# Patient Record
Sex: Female | Born: 1985 | Race: Black or African American | Hispanic: No | Marital: Married | Smoking: Never smoker
Health system: Southern US, Community
[De-identification: ages and names within clinical notes are randomized; demographics above are authoritative.]

## PROBLEM LIST (undated history)

## (undated) DIAGNOSIS — Z803 Family history of malignant neoplasm of breast: Secondary | ICD-10-CM

## (undated) DIAGNOSIS — Q249 Congenital malformation of heart, unspecified: Secondary | ICD-10-CM

## (undated) HISTORY — DX: Family history of malignant neoplasm of breast: Z80.3

## (undated) HISTORY — PX: PACEMAKER INSERTION: SHX728

---

## 2005-08-17 ENCOUNTER — Other Ambulatory Visit: Admission: RE | Admit: 2005-08-17 | Discharge: 2005-08-17 | Payer: Self-pay | Admitting: Obstetrics and Gynecology

## 2006-02-05 ENCOUNTER — Inpatient Hospital Stay (HOSPITAL_COMMUNITY): Admission: AD | Admit: 2006-02-05 | Discharge: 2006-02-05 | Payer: Self-pay | Admitting: Obstetrics and Gynecology

## 2006-02-07 ENCOUNTER — Inpatient Hospital Stay (HOSPITAL_COMMUNITY): Admission: AD | Admit: 2006-02-07 | Discharge: 2006-02-09 | Payer: Self-pay | Admitting: Obstetrics and Gynecology

## 2008-04-01 ENCOUNTER — Emergency Department (HOSPITAL_COMMUNITY): Admission: EM | Admit: 2008-04-01 | Discharge: 2008-04-01 | Payer: Self-pay | Admitting: Emergency Medicine

## 2010-04-27 ENCOUNTER — Other Ambulatory Visit (HOSPITAL_COMMUNITY)
Admission: RE | Admit: 2010-04-27 | Discharge: 2010-04-27 | Disposition: A | Discharge status: Home / Self Care | Payer: Self-pay | Source: Ambulatory Visit | Attending: Family Medicine | Admitting: Family Medicine

## 2010-04-27 DIAGNOSIS — Z124 Encounter for screening for malignant neoplasm of cervix: Secondary | ICD-10-CM | POA: Insufficient documentation

## 2010-06-08 LAB — URINE CULTURE: Colony Count: >=100000

## 2010-06-08 LAB — URINALYSIS, ROUTINE W REFLEX MICROSCOPIC
Glucose, UA: NEGATIVE mg/dL
Hgb urine dipstick: NEGATIVE
Ketones, ur: NEGATIVE mg/dL
Protein, ur: NEGATIVE mg/dL
Urobilinogen, UA: 0.2 mg/dL (ref 0.0–1.0)

## 2010-06-08 LAB — WET PREP, GENITAL
Clue Cells Wet Prep HPF POC: NONE SEEN
Trich, Wet Prep: NONE SEEN

## 2010-06-08 LAB — URINE MICROSCOPIC-ADD ON

## 2010-06-08 LAB — POCT PREGNANCY, URINE: Preg Test, Ur: NEGATIVE

## 2010-07-09 NOTE — H&P (Signed)
NAMENORIE, LATENDRESSE                 ACCOUNT NO.:  [id number]   MEDICAL RECORD NO.:  [id number]          PATIENT TYPE:  INP   LOCATION:  9169                          FACILITY:  [facility]   PHYSICIAN:  Osborn Coho, M.D.   DATE OF BIRTH:  [birth year]   DATE OF ADMISSION:  02/07/2006  DATE OF DISCHARGE:                              HISTORY & PHYSICAL   Ms. Chohan is a 25 year old gravida 1, para 0 at 41-3/7 weeks, EDD  January 28, 2006. She presents for induction of labor secondary to post  dates. Her pregnancy has been followed by the M.D. service at [facility] and  is remarkable for a history of congenital heart disease, congenital AV  block with pacemaker 2005, history of abnormal Pap smear, group B strep  negative.  This patient entered prenatal care at the office of [facility] on  August 17, 2005, [facility] determined by 17-week ultrasound and confirmed with  follow-up. Her pregnancy is significant for her history of congenital AV  block with pacemaker in 2005.  Maternal fetal consult was obtained and  the patient per maternal fetal medicine the patient was tested for SSA  and SSB antibodies. These were negative. Fetal echocardiogram found VSD  and baby will be followed post delivery. Cardiologist Dr. Chales Abrahams was  consulted and reports there are no contraindications to pushing during  the second stage.  If operative delivery is needed a magnet over  pacemaker is important during electrocautery use. Anesthesia has been  consulted and no problems were identified as far as anesthesia.  Patient's pregnancy itself has been fairly unremarkable.  She has been  size equal to dates throughout.  She has been normotensive until the  last several weeks of pregnancy.  She has had some elevated blood  pressures. Blood pressure today on admission is 120/64. PIH labs done on  Sunday February 05, 2006 were all within normal limits.  Clean catch  urine protein at that time was negative.  The patient did complete a 24-  hour  urine which was returned yesterday and results are unavailable at  this time.   PRENATAL LAB WORK:  On August 17, 2005 hemoglobin and hematocrit 12.2 and  36.6, platelets 232,000.  Blood type and Rh A+, antibody screen  negative, VDRL nonreactive, rubella immune, hepatitis B surface antigen  negative, HIV nonreactive.  Pap smear within normal limits.  GC and  chlamydia negative.  Quad screen within normal limits. At 26 weeks 1-  hour glucose challenge within normal limits and RPR negative at 36  weeks. Culture of the vaginal tract is negative for group B strep. The  patient has no known drug allergies and she denies the use of tobacco,  alcohol or illicit drugs.  Her height is 5 feet 6 inches, her pre gravid  weight is 160 and her last recorded prenatal weight is 206.   MEDICAL HISTORY:  Is significant for congenital heart defect and she had  a pacemaker placed in 2005. She has had gastric reflux symptoms during  her pregnancy for which she takes Protonix with relief.   FAMILY HISTORY:  Is unremarkable.   GENETIC HISTORY:  As above, congenital heart defect with pacemaker  placed in 2005.   SOCIAL HISTORY:  Ms. Mackowski is a single African American female.  The  father of the baby, Walter [place], is involved and supportive.  They do  not subscribe to a religious faith.   REVIEW OF SYSTEMS:  Is as described above.  The patient presents for  induction of labor secondary to post dates.   PHYSICAL EXAM:  VITAL SIGNS: Stable.  The patient is afebrile.  Blood  pressure 120/64.  HEENT:  Is unremarkable.  HEART:  Regular rate and rhythm.  LUNGS: Clear.  ABDOMEN:  Gravid in its contour.  It is soft and nontender.  Uterine  fundus extends 39 cm above the level of the pubic symphysis.  Leopold's  maneuvers finds the infant to be in a longitudinal lie, cephalic  presentation and the estimated fetal weight is 7 pounds 15 ounces. The  baseline of the fetal heart rate monitor is 140's with average  long-term  variability, reactivity is present with no periodic changes.  The  patient is contracting mildly and irregularly.  EXTREMITIES:  Show 1+ edema.  DTRs are 2+ with no clonus.  There is no  calf tenderness noted bilaterally.   ASSESSMENT:  1. Intrauterine pregnancy at 41-3/7 weeks.  2. Induction of labor post dates.  3. History of congenital heart disease and pacemaker 2005.   PLAN:  Admit per Dr. Osborn Coho, begin Pitocin for stimulation of  labor with orders for pain medicine as written.      Rica Koyanagi, C.N.M.      Osborn Coho, M.D.  Electronically Signed    SDM/MEDQ  D:  02/07/2006  T:  02/07/2006  Job:  [id number]

## 2012-05-16 ENCOUNTER — Encounter (HOSPITAL_COMMUNITY): Payer: Self-pay | Admitting: *Deleted

## 2012-05-16 ENCOUNTER — Emergency Department (HOSPITAL_COMMUNITY): Payer: BC Managed Care – PPO

## 2012-05-16 ENCOUNTER — Emergency Department (HOSPITAL_COMMUNITY)
Admission: EM | Admit: 2012-05-16 | Discharge: 2012-05-16 | Disposition: A | Discharge status: Home / Self Care | Payer: BC Managed Care – PPO | Attending: Emergency Medicine | Admitting: Emergency Medicine

## 2012-05-16 DIAGNOSIS — Z950 Presence of cardiac pacemaker: Secondary | ICD-10-CM | POA: Insufficient documentation

## 2012-05-16 DIAGNOSIS — Z3202 Encounter for pregnancy test, result negative: Secondary | ICD-10-CM | POA: Insufficient documentation

## 2012-05-16 DIAGNOSIS — Z8774 Personal history of (corrected) congenital malformations of heart and circulatory system: Secondary | ICD-10-CM | POA: Insufficient documentation

## 2012-05-16 DIAGNOSIS — R109 Unspecified abdominal pain: Secondary | ICD-10-CM | POA: Insufficient documentation

## 2012-05-16 DIAGNOSIS — R110 Nausea: Secondary | ICD-10-CM | POA: Insufficient documentation

## 2012-05-16 DIAGNOSIS — R509 Fever, unspecified: Secondary | ICD-10-CM | POA: Insufficient documentation

## 2012-05-16 HISTORY — DX: Congenital malformation of heart, unspecified: Q24.9

## 2012-05-16 LAB — COMPREHENSIVE METABOLIC PANEL
ALT: 14 U/L (ref 0–35)
AST: 17 U/L (ref 0–37)
CO2: 23 mEq/L (ref 19–32)
Calcium: 9.3 mg/dL (ref 8.4–10.5)
GFR calc non Af Amer: >90 mL/min (ref >90)
Sodium: 137 mEq/L (ref 135–145)
Total Protein: 8.5 g/dL — ABNORMAL HIGH (ref 6.0–8.3)

## 2012-05-16 LAB — CBC WITH DIFFERENTIAL/PLATELET
Basophils Absolute: 0 10*3/uL (ref 0.0–0.1)
Eosinophils Relative: 0 % (ref 0–5)
Lymphocytes Relative: 12 % (ref 12–46)
MCV: 79.1 fL (ref 78.0–100.0)
Neutrophils Relative %: 80 % — ABNORMAL HIGH (ref 43–77)
Platelets: 243 10*3/uL (ref 150–400)
RDW: 13.1 % (ref 11.5–15.5)
WBC: 11.2 10*3/uL — ABNORMAL HIGH (ref 4.0–10.5)

## 2012-05-16 LAB — URINALYSIS, ROUTINE W REFLEX MICROSCOPIC
Nitrite: NEGATIVE
Protein, ur: NEGATIVE mg/dL
Urobilinogen, UA: 1 mg/dL (ref 0.0–1.0)

## 2012-05-16 LAB — WET PREP, GENITAL
Clue Cells Wet Prep HPF POC: NONE SEEN
Trich, Wet Prep: NONE SEEN

## 2012-05-16 MED ORDER — IOHEXOL 300 MG/ML  SOLN
50.0000 mL | Freq: Once | INTRAMUSCULAR | Status: AC | PRN
  Administered 2012-05-16: 50 mL via ORAL

## 2012-05-16 MED ORDER — HYDROCODONE-ACETAMINOPHEN 5-325 MG PO TABS: 1.0000 | ORAL_TABLET | Freq: Four times a day (QID) | ORAL | Status: DC | PRN

## 2012-05-16 MED ORDER — IOHEXOL 300 MG/ML  SOLN
100.0000 mL | Freq: Once | INTRAMUSCULAR | Status: AC | PRN
  Administered 2012-05-16: 100 mL via INTRAVENOUS

## 2012-05-16 MED ORDER — SODIUM CHLORIDE 0.9 % IV SOLN
Freq: Once | INTRAVENOUS | Status: AC
  Administered 2012-05-16: 12:00:00 via INTRAVENOUS

## 2012-05-16 NOTE — ED Provider Notes (Signed)
History     CSN: [id number]  Arrival date & time 05/16/12  1011   First MD Initiated Contact with Patient 05/16/12 1018      Chief Complaint  Patient presents with  . sent from pcp for abdominal pain     (Consider location/radiation/quality/duration/timing/severity/associated sxs/prior treatment) HPI Melinda Medina is a 27 y.o. female who presents to ED with complaint of abdominal and back pain. States pain started 2 days ago. Associated with fever, chills, nausea, poor appetite. States went to see her PCP, was found to have elevated WBC of 14, and WBCs in urine. Was sent here to r/o pyelonephritis vs appendicitis. Pt states pain mainly in the right lower back, does radiate to the front. Worsened with standing and moving. No mediations take for the same. No hx of the same. Denies chest pain, urinary symptoms, vaginal symptoms.   Past Medical History  Diagnosis Date  . Congenital heart defect     pacemaker placed in 2005    Past Surgical History  Procedure Laterality Date  . Pacemaker insertion      placed 2005    History reviewed. No pertinent family history.  History  Substance Use Topics  . Smoking status: Never Smoker   . Smokeless tobacco: Not on file  . Alcohol Use: No    OB History   Grav Para Term Preterm Abortions TAB SAB Ect Mult Living                  Review of Systems  Constitutional: Positive for fever and chills.  HENT: Negative for neck pain and neck stiffness.   Respiratory: Negative.   Cardiovascular: Negative.   Gastrointestinal: Positive for nausea and abdominal pain. Negative for vomiting, diarrhea and constipation.  Genitourinary: Negative for hematuria, flank pain, vaginal bleeding, vaginal discharge and vaginal pain.  All other systems reviewed and are negative.    Allergies  Review of patient's allergies indicates no known allergies.  Home Medications  No current outpatient prescriptions on file.  BP 123/75  Pulse 110  Temp(Src)  98.1 F (36.7 C) (Oral)  Resp 16  SpO2 95%  Physical Exam  Nursing note and vitals reviewed. Constitutional: She appears well-developed and well-nourished. No distress.  HENT:  Head: Normocephalic.  Eyes: Conjunctivae are normal.  Cardiovascular: Normal rate, regular rhythm and normal heart sounds.   Pulmonary/Chest: Effort normal and breath sounds normal. No respiratory distress. She has no wheezes. She has no rales.  Abdominal: Soft. She exhibits no distension. There is no tenderness. There is no rebound and no guarding.  No CVA tenderness  Musculoskeletal: She exhibits no edema.  Neurological: She is alert.  Skin: Skin is warm and dry.    ED Course  Procedures (including critical care time)  PT sent from PCP's office for r/o appy vs pyelonephritis. Repeat UA, blood tests, CT abd pelvis. Pt is non tender over her abdomen on my exam.   Results for orders placed during the hospital encounter of 05/16/12  WET PREP, GENITAL      Result Value Range   Yeast Wet Prep HPF POC NONE SEEN  NONE SEEN   Trich, Wet Prep NONE SEEN  NONE SEEN   Clue Cells Wet Prep HPF POC NONE SEEN  NONE SEEN   WBC, Wet Prep HPF POC MANY (*) NONE SEEN  URINALYSIS, ROUTINE W REFLEX MICROSCOPIC      Result Value Range   Color, Urine YELLOW  YELLOW   APPearance CLEAR  CLEAR  Specific Gravity, Urine 1.020  1.005 - 1.030   pH 7.5  5.0 - 8.0   Glucose, UA NEGATIVE  NEGATIVE mg/dL   Hgb urine dipstick NEGATIVE  NEGATIVE   Bilirubin Urine NEGATIVE  NEGATIVE   Ketones, ur >80 (*) NEGATIVE mg/dL   Protein, ur NEGATIVE  NEGATIVE mg/dL   Urobilinogen, UA 1.0  0.0 - 1.0 mg/dL   Nitrite NEGATIVE  NEGATIVE   Leukocytes, UA SMALL (*) NEGATIVE  CBC WITH DIFFERENTIAL      Result Value Range   WBC 11.2 (*) 4.0 - 10.5 K/uL   RBC 5.26 (*) 3.87 - 5.11 MIL/uL   Hemoglobin 13.7  12.0 - 15.0 g/dL   HCT 96.0  45.4 - 09.8 %   MCV 79.1  78.0 - 100.0 fL   MCH 26.0  26.0 - 34.0 pg   MCHC 32.9  30.0 - 36.0 g/dL   RDW 11.9   14.7 - 82.9 %   Platelets 243  150 - 400 K/uL   Neutrophils Relative 80 (*) 43 - 77 %   Neutro Abs 8.9 (*) 1.7 - 7.7 K/uL   Lymphocytes Relative 12  12 - 46 %   Lymphs Abs 1.3  0.7 - 4.0 K/uL   Monocytes Relative 8  3 - 12 %   Monocytes Absolute 0.9  0.1 - 1.0 K/uL   Eosinophils Relative 0  0 - 5 %   Eosinophils Absolute 0.0  0.0 - 0.7 K/uL   Basophils Relative 0  0 - 1 %   Basophils Absolute 0.0  0.0 - 0.1 K/uL  COMPREHENSIVE METABOLIC PANEL      Result Value Range   Sodium 137  135 - 145 mEq/L   Potassium 3.8  3.5 - 5.1 mEq/L   Chloride 100  96 - 112 mEq/L   CO2 23  19 - 32 mEq/L   Glucose, Bld 80  70 - 99 mg/dL   BUN 10  6 - 23 mg/dL   Creatinine, Ser 5.62  0.50 - 1.10 mg/dL   Calcium 9.3  8.4 - 13.0 mg/dL   Total Protein 8.5 (*) 6.0 - 8.3 g/dL   Albumin 3.9  3.5 - 5.2 g/dL   AST 17  0 - 37 U/L   ALT 14  0 - 35 U/L   Alkaline Phosphatase 68  39 - 117 U/L   Total Bilirubin 0.7  0.3 - 1.2 mg/dL   GFR calc non Af Amer >90  >90 mL/min   GFR calc Af Amer >90  >90 mL/min  LIPASE, BLOOD      Result Value Range   Lipase 23  11 - 59 U/L  URINE MICROSCOPIC-ADD ON      Result Value Range   Squamous Epithelial / LPF MANY (*) RARE   WBC, UA 3-6  <3 WBC/hpf   Bacteria, UA RARE  RARE  POCT PREGNANCY, URINE      Result Value Range   Preg Test, Ur NEGATIVE  NEGATIVE       1. Abdominal pain       MDM  Pt with right sided abdominal pain. Pt non tender on my exam. UA here does not show any signs of infection, no CVA tenderness. CT abdomen obtained, negative. Discussed with pt. No pelvic exam findings, doubt PID, did not treat even though an WBCs. No surgical abdomen on exam, in fact, no abdominal tendernss. Cultures of gc/chlamydia and urine sent. Pt is afebrile here. WBC is mildly elevated. Instructed to return  if worsening. Pt voiced understanding.  Filed Vitals:   05/16/12 1026 05/16/12 1140 05/16/12 1230 05/16/12 1316  BP: 123/75 113/69  113/71  Pulse: 110 94 97   Temp:  98.1 F (36.7 C) 98.2 F (36.8 C)  98.8 F (37.1 C)  TempSrc: Oral Oral  Oral  Resp: 16 18 24 21   SpO2: 95% 100% 100%          Osric Klopf A Derrika Ruffalo, PA-C 05/16/12 1525

## 2012-05-16 NOTE — ED Notes (Signed)
Pt escorted to discharge window. Pt verbalized understanding discharge instructions. In no acute distress.  

## 2012-05-16 NOTE — Progress Notes (Signed)
Pt confirms pcp is  Dr Zachery Dauer at [facility] updated

## 2012-05-16 NOTE — ED Notes (Signed)
Pt back from CT

## 2012-05-16 NOTE — ED Notes (Signed)
Unable to obtain blood during IV access. Phlebotomy alerted

## 2012-05-16 NOTE — ED Notes (Signed)
PA at bedside.

## 2012-05-16 NOTE — ED Notes (Addendum)
Pt sent from [place], note from physician and pt reports has been sick x2 days with chills, RLQ pain and right mid back pain. Pain 6/10 at present. Pt denies dysuria, reports frequency. Some nausea, denies v/d.   Pacemaker placed in 2005 due to congenital heart defect.

## 2012-05-16 NOTE — ED Notes (Signed)
Pt to CT

## 2012-05-16 NOTE — ED Notes (Signed)
[id number]<AV> Expected date:<BR> Expected time:<BR> Means of arrival:<BR> Comments:<BR> Anand

## 2012-05-17 LAB — GC/CHLAMYDIA PROBE AMP
CT Probe RNA: NEGATIVE
GC Probe RNA: NEGATIVE

## 2012-05-18 LAB — URINE CULTURE: Colony Count: >=100000

## 2012-05-18 NOTE — ED Provider Notes (Signed)
Medical screening examination/treatment/procedure(s) were performed by non-physician practitioner and as supervising physician I was immediately available for consultation/collaboration.   Masey Scheiber M Brittiny Levitz, DO 05/18/12 1830 

## 2012-05-19 ENCOUNTER — Telehealth (HOSPITAL_COMMUNITY): Payer: Self-pay | Admitting: Emergency Medicine

## 2012-05-19 NOTE — ED Notes (Signed)
Patient has +Urine culture. Checking to see if appropriately treated. °

## 2012-05-19 NOTE — ED Notes (Signed)
+   Urine Chart sent to EDP office for review. 

## 2012-05-24 ENCOUNTER — Telehealth (HOSPITAL_COMMUNITY): Payer: Self-pay | Admitting: Emergency Medicine

## 2012-05-25 ENCOUNTER — Telehealth (HOSPITAL_COMMUNITY): Payer: Self-pay | Admitting: Emergency Medicine

## 2012-05-26 ENCOUNTER — Telehealth (HOSPITAL_COMMUNITY): Payer: Self-pay | Admitting: Emergency Medicine

## 2012-05-27 ENCOUNTER — Telehealth (HOSPITAL_COMMUNITY): Payer: Self-pay | Admitting: Emergency Medicine

## 2012-05-27 NOTE — ED Notes (Signed)
Unable to contact patient via phone. Sent letter. °

## 2012-05-30 ENCOUNTER — Telehealth (HOSPITAL_COMMUNITY): Payer: Self-pay | Admitting: Emergency Medicine

## 2014-11-27 DIAGNOSIS — Z950 Presence of cardiac pacemaker: Secondary | ICD-10-CM | POA: Insufficient documentation

## 2014-11-27 DIAGNOSIS — Q246 Congenital heart block: Secondary | ICD-10-CM | POA: Insufficient documentation

## 2015-03-20 ENCOUNTER — Encounter (HOSPITAL_COMMUNITY): Payer: Self-pay | Admitting: Emergency Medicine

## 2015-03-20 ENCOUNTER — Emergency Department (HOSPITAL_COMMUNITY): Payer: BC Managed Care – PPO

## 2015-03-20 ENCOUNTER — Emergency Department (HOSPITAL_COMMUNITY)
Admission: EM | Admit: 2015-03-20 | Discharge: 2015-03-20 | Disposition: A | Discharge status: Home / Self Care | Payer: BC Managed Care – PPO | Attending: Emergency Medicine | Admitting: Emergency Medicine

## 2015-03-20 DIAGNOSIS — R51 Headache: Secondary | ICD-10-CM | POA: Insufficient documentation

## 2015-03-20 DIAGNOSIS — Z950 Presence of cardiac pacemaker: Secondary | ICD-10-CM | POA: Insufficient documentation

## 2015-03-20 DIAGNOSIS — Z792 Long term (current) use of antibiotics: Secondary | ICD-10-CM | POA: Diagnosis not present

## 2015-03-20 DIAGNOSIS — Z8774 Personal history of (corrected) congenital malformations of heart and circulatory system: Secondary | ICD-10-CM | POA: Insufficient documentation

## 2015-03-20 DIAGNOSIS — R110 Nausea: Secondary | ICD-10-CM | POA: Insufficient documentation

## 2015-03-20 DIAGNOSIS — Z3202 Encounter for pregnancy test, result negative: Secondary | ICD-10-CM | POA: Insufficient documentation

## 2015-03-20 DIAGNOSIS — H538 Other visual disturbances: Secondary | ICD-10-CM | POA: Insufficient documentation

## 2015-03-20 DIAGNOSIS — R42 Dizziness and giddiness: Secondary | ICD-10-CM | POA: Insufficient documentation

## 2015-03-20 DIAGNOSIS — R079 Chest pain, unspecified: Secondary | ICD-10-CM | POA: Insufficient documentation

## 2015-03-20 DIAGNOSIS — R002 Palpitations: Secondary | ICD-10-CM | POA: Diagnosis not present

## 2015-03-20 DIAGNOSIS — Z87440 Personal history of urinary (tract) infections: Secondary | ICD-10-CM | POA: Insufficient documentation

## 2015-03-20 LAB — CBC WITH DIFFERENTIAL/PLATELET
Basophils Absolute: 0 10*3/uL (ref 0.0–0.1)
Basophils Relative: 0 %
EOS PCT: 2 %
Eosinophils Absolute: 0.2 10*3/uL (ref 0.0–0.7)
HCT: 38.4 % (ref 36.0–46.0)
HEMOGLOBIN: 13 g/dL (ref 12.0–15.0)
LYMPHS ABS: 1.4 10*3/uL (ref 0.7–4.0)
LYMPHS PCT: 19 %
MCH: 26.6 pg (ref 26.0–34.0)
MCHC: 33.9 g/dL (ref 30.0–36.0)
MCV: 78.5 fL (ref 78.0–100.0)
Monocytes Absolute: 0.8 10*3/uL (ref 0.1–1.0)
Monocytes Relative: 11 %
NEUTROS PCT: 68 %
Neutro Abs: 5.2 10*3/uL (ref 1.7–7.7)
Platelets: 301 10*3/uL (ref 150–400)
RBC: 4.89 MIL/uL (ref 3.87–5.11)
RDW: 13.4 % (ref 11.5–15.5)
WBC: 7.6 10*3/uL (ref 4.0–10.5)

## 2015-03-20 LAB — URINALYSIS, ROUTINE W REFLEX MICROSCOPIC
Bilirubin Urine: NEGATIVE
Glucose, UA: NEGATIVE mg/dL
Hgb urine dipstick: NEGATIVE
Ketones, ur: 40 mg/dL — AB
NITRITE: NEGATIVE
PH: 5.5 (ref 5.0–8.0)
Protein, ur: NEGATIVE mg/dL
SPECIFIC GRAVITY, URINE: 1.014 (ref 1.005–1.030)

## 2015-03-20 LAB — URINE MICROSCOPIC-ADD ON

## 2015-03-20 LAB — I-STAT BETA HCG BLOOD, ED (MC, WL, AP ONLY): I-stat hCG, quantitative: <5 m[IU]/mL (ref <5)

## 2015-03-20 LAB — BASIC METABOLIC PANEL
Anion gap: 10 (ref 5–15)
BUN: 9 mg/dL (ref 6–20)
CHLORIDE: 106 mmol/L (ref 101–111)
CO2: 22 mmol/L (ref 22–32)
Calcium: 9.1 mg/dL (ref 8.9–10.3)
Creatinine, Ser: 0.87 mg/dL (ref 0.44–1.00)
GFR calc Af Amer: >60 mL/min (ref >60)
GFR calc non Af Amer: >60 mL/min (ref >60)
GLUCOSE: 82 mg/dL (ref 65–99)
POTASSIUM: 4 mmol/L (ref 3.5–5.1)
Sodium: 138 mmol/L (ref 135–145)

## 2015-03-20 LAB — I-STAT TROPONIN, ED: Troponin i, poc: 0 ng/mL (ref 0.00–0.08)

## 2015-03-20 MED ORDER — IOHEXOL 350 MG/ML SOLN
80.0000 mL | Freq: Once | INTRAVENOUS | Status: AC | PRN
  Administered 2015-03-20: 80 mL via INTRAVENOUS

## 2015-03-20 NOTE — ED Notes (Signed)
Pt to ER via [facility] with complaint of sudden onset blurred vision, dizziness, and chest tightness. Pt has pacemaker in place. No neuro deficits noted at this time. Pt is being treated for UTI with Bactrim since Tuesday, PCP told her this was a side effect of the medication. A/O x4. VSS.

## 2015-03-20 NOTE — ED Provider Notes (Signed)
CSN: [id number]     Arrival date & time 03/20/15  1133 History   First MD Initiated Contact with Patient 03/20/15 1139     Chief Complaint  Patient presents with  . Chest Pain  . Shoulder Pain  . Blurred Vision     (Consider location/radiation/quality/duration/timing/severity/associated sxs/prior Treatment) HPI   Patient is a 30 year old female with past medical history of a pacemaker (due to 3rd degree heart block) who presents to the ED with complaint of blurred vision, onset 8 AM. Patient reports while she was at work sitting at her desk she began having constant blurred vision with a bright white line in her visual field to bilateral eyes. She notes around approximately 10 AM she began having midsternal sharp chest pain, denies any aggravating or alleviating factors. Endorses associated palpitations and nausea. Pt reports her CP, palpitations and nausea resolved appx. 30 min later but notes her blurred vision remained. Denies fever, chills, headache, lightheadedness, dizziness, numbness, tingling, weakness, congestion, cough, SOB, wheezing, abdominal pain, vomiting, diarrhea, urinary sxs. Pt notes she recently had her pacemaker replaced in September 2016. She notes she is currently being tx with a UTI with bactrim which she started on Tuesday.  Past Medical History  Diagnosis Date  . Congenital heart defect     pacemaker placed in 2005   Past Surgical History  Procedure Laterality Date  . Pacemaker insertion      placed 2005   History reviewed. No pertinent family history. Social History  Substance Use Topics  . Smoking status: Never Smoker   . Smokeless tobacco: None  . Alcohol Use: No   OB History    No data available     Review of Systems  Eyes: Positive for visual disturbance (blurred vision).  Cardiovascular: Positive for chest pain and palpitations.  Gastrointestinal: Positive for nausea.  Neurological: Positive for dizziness and headaches.  All other systems  reviewed and are negative.     Allergies  Review of patient's allergies indicates no known allergies.  Home Medications   Prior to Admission medications   Not on File   BP 106/63 mmHg  Pulse 66  Temp(Src) 97.9 F (36.6 C) (Oral)  Resp 18  Ht  (1.702 m)  Wt 118.842 kg  BMI 41.03 kg/m2  SpO2 100%  LMP 02/28/2015 Physical Exam  Constitutional: She is oriented to person, place, and time. She appears well-developed and well-nourished.  HENT:  Head: Normocephalic and atraumatic.  Right Ear: Tympanic membrane normal.  Left Ear: Tympanic membrane normal.  Nose: Nose normal.  Mouth/Throat: Uvula is midline, oropharynx is clear and moist and mucous membranes are normal. No oropharyngeal exudate.  Eyes: Conjunctivae, EOM and lids are normal. Pupils are equal, round, and reactive to light. Right eye exhibits no discharge. Left eye exhibits no discharge. No scleral icterus. Right eye exhibits no nystagmus. Left eye exhibits no nystagmus.  Neck: Normal range of motion. Neck supple.  Cardiovascular: Normal rate, regular rhythm, normal heart sounds and intact distal pulses.   Pulmonary/Chest: Effort normal and breath sounds normal. No respiratory distress. She has no wheezes. She has no rales. She exhibits no tenderness.  Abdominal: Soft. Bowel sounds are normal. She exhibits no distension and no mass. There is no tenderness. There is no rebound and no guarding.  Musculoskeletal: Normal range of motion. She exhibits no edema or tenderness.  Lymphadenopathy:    She has no cervical adenopathy.  Neurological: She is alert and oriented to person, place, and time. She has  normal strength. No cranial nerve deficit or sensory deficit. She displays a negative Romberg sign. Coordination normal.  Skin: Skin is warm and dry.  Nursing note and vitals reviewed.   ED Course  Procedures (including critical care time) Labs Review Labs Reviewed  URINALYSIS, ROUTINE W REFLEX MICROSCOPIC (NOT AT  [facility]) - Abnormal; Notable for the following:    Ketones, ur 40 (*)    Leukocytes, UA SMALL (*)    All other components within normal limits  URINE MICROSCOPIC-ADD ON - Abnormal; Notable for the following:    Squamous Epithelial / LPF 0-5 (*)    Bacteria, UA FEW (*)    All other components within normal limits  CBC WITH DIFFERENTIAL/PLATELET  BASIC METABOLIC PANEL  I-STAT TROPOININ, ED  I-STAT BETA HCG BLOOD, ED ([facility], [facility], [facility] ONLY)    Imaging Review Dg Chest 2 View  03/20/2015  CLINICAL DATA:  Chest pain and dizziness EXAM: CHEST  2 VIEW COMPARISON:  September 01, 2003 FINDINGS: Lungs are clear. Heart size and pulmonary vascularity are normal. No adenopathy. No pneumothorax. Pacemaker leads are attached to the right atrium and right ventricle. No bone lesions. IMPRESSION: No edema or consolidation. Electronically Signed   By: Bretta Bang III M.D.   On: 03/20/2015 13:02   I have personally reviewed and evaluated these images and lab results as part of my medical decision-making.   EKG Interpretation None      MDM   Final diagnoses:  Blurred vision    Pt presents with blurred vision and an episode of CP, palpitations, nausea, dizziness and headache which have since resolved. Hx of pacemaker due to heart block at age of 52. VSS. EKG showed sinus rhythm with no acute ST changes. Troponin negative. Pregnancy negative. Labs and UA unremarkable. CXR negative. Visual acuity unremarkable. Pacekmaker interrogation unremarkable. Negative orthostatics.   On reevaluation, pt continues to reporting having blurred vision. Will order CT head and CTA head and neck for further evaluation due to concern for vascular etiology causing neurological sxs. CT head and CTAs negative.   Pt reports her blurred vision has improved and almost completely resolved since returning from CT. I have a low suspicion for ACS, dissection or other acute cardiac/vascular event at this time and feel that pt is appropriate to  be d/c home with outpatient follow up with ophthalmology. Discussed results and plan for d/c with pt.  Evaluation does not show pathology requring ongoing emergent intervention or admission. Pt is hemodynamically stable and mentating appropriately. All questions answered. Return precautions discussed and outpatient follow up given.         Satira Sark [place], [place] 03/20/15 1937  Marily Memos, MD 03/21/15 1544

## 2015-03-20 NOTE — Discharge Instructions (Signed)
Continue taking your home medications as prescribed.  Follow up with your primary care provider in 3-4 days. I also recommend following up with the Ophthalmology clinic listed above in the next 3 days. Please return to the Emergency Department if symptoms worsen or new onset of fever, headache, visual changes, lightheadedness, dizziness, numbness, tingling, weakness, chest pain, shortness of breath, syncope, seizure.

## 2015-03-20 NOTE — ED Notes (Signed)
patient said she was having a hard time focusing with her left eye.Made several attempts with the eye chart

## 2016-09-16 ENCOUNTER — Other Ambulatory Visit: Payer: Self-pay | Admitting: Family Medicine

## 2016-09-16 ENCOUNTER — Other Ambulatory Visit (HOSPITAL_COMMUNITY)
Admission: RE | Admit: 2016-09-16 | Discharge: 2016-09-16 | Disposition: A | Discharge status: Home / Self Care | Payer: BC Managed Care – PPO | Source: Ambulatory Visit | Attending: Family Medicine | Admitting: Family Medicine

## 2016-09-16 DIAGNOSIS — Z124 Encounter for screening for malignant neoplasm of cervix: Secondary | ICD-10-CM | POA: Diagnosis present

## 2016-09-20 LAB — CYTOLOGY - PAP
Adequacy: ABSENT
DIAGNOSIS: NEGATIVE

## 2016-10-28 ENCOUNTER — Other Ambulatory Visit: Payer: Self-pay | Admitting: Family Medicine

## 2016-10-28 ENCOUNTER — Other Ambulatory Visit (HOSPITAL_COMMUNITY)
Admission: RE | Admit: 2016-10-28 | Discharge: 2016-10-28 | Disposition: A | Discharge status: Home / Self Care | Payer: BC Managed Care – PPO | Source: Ambulatory Visit | Attending: Family Medicine | Admitting: Family Medicine

## 2016-10-28 DIAGNOSIS — Z124 Encounter for screening for malignant neoplasm of cervix: Secondary | ICD-10-CM | POA: Insufficient documentation

## 2016-10-31 LAB — CYTOLOGY - PAP: Diagnosis: NEGATIVE

## 2017-05-03 IMAGING — CT CT ANGIO HEAD
1 of 12 series · 1 of 33 positions shown · IV contrast (Iohexol (Omnipaque 350))
Comparison: None.

CLINICAL DATA: Slight posterior headache and blurry vision since
this morning.

EXAM:
CT HEAD WITHOUT CONTRAST
CT ANGIOGRAPHY OF THE HEAD AND NECK
TECHNIQUE: Contiguous axial images were obtained from the base of the skull
through the vertex without intravenous contrast. Multidetector CT
imaging of the head and neck was performed using the standard
protocol during bolus administration of intravenous contrast.
Multiplanar CT image reconstructions and MIPs were obtained to
evaluate the vascular anatomy. Carotid stenosis measurements (when
applicable) are obtained utilizing NASCET criteria, using the distal
internal carotid diameter as the denominator.
CONTRAST:  80mL OMNIPAQUE IOHEXOL 350 MG/ML SOLN

[Series 300: locator · axial · 0.51mm/px · 1 of 1 slices shown]
[im 1/1  soft-tissue]
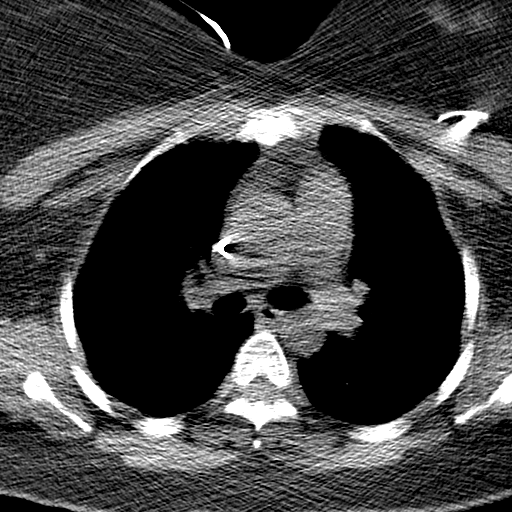

[1 of 33 positions shown; findings below may reference images not displayed]

FINDINGS: CT HEAD WITHOUT CONTRAST

No mass effect or midline shift. No evidence of acute intracranial
hemorrhage, or infarction. No abnormal extra-axial fluid
collections. Gray-white matter differentiation is normal. Basal
cisterns are preserved.

No depressed skull fractures. Visualized paranasal sinuses and
mastoid air cells are not opacified.

CT ANGIOGRAPHY OF THE HEAD AND NECK

CTA NECK

Aortic arch: Standard branching. Imaged portion shows no evidence of
aneurysm or dissection. No significant stenosis of the major arch
vessel origins.

Right carotid system: No evidence of dissection, stenosis (50% or
greater) or occlusion.

Left carotid system: No evidence of dissection, stenosis (50% or
greater) or occlusion.

Vertebral arteries: Codominant. No evidence of dissection, stenosis
(50% or greater) or occlusion.

CTA HEAD

Anterior circulation: No significant stenosis, proximal occlusion,
aneurysm, or vascular malformation.

Posterior circulation: No significant stenosis, proximal occlusion,
aneurysm, or vascular malformation.

Venous sinuses: As permitted by contrast timing, patent.

Anatomic variants: None of significance.

Delayed phase:   No abnormal intracranial enhancement.
IMPRESSION: Normal noncontrasted CT of the head.

Normal CT angiogram of the head and neck.

## 2018-12-26 ENCOUNTER — Ambulatory Visit: Payer: BC Managed Care – PPO | Admitting: Podiatry

## 2018-12-26 ENCOUNTER — Ambulatory Visit (INDEPENDENT_AMBULATORY_CARE_PROVIDER_SITE_OTHER): Payer: BC Managed Care – PPO

## 2018-12-26 ENCOUNTER — Other Ambulatory Visit: Payer: Self-pay

## 2018-12-26 DIAGNOSIS — M7672 Peroneal tendinitis, left leg: Secondary | ICD-10-CM | POA: Diagnosis not present

## 2018-12-26 DIAGNOSIS — M79672 Pain in left foot: Secondary | ICD-10-CM

## 2018-12-26 MED ORDER — MELOXICAM 15 MG PO TABS: 15.0000 mg | ORAL_TABLET | Freq: Every day | ORAL | 1 refills | Status: DC

## 2018-12-26 MED ORDER — METHYLPREDNISOLONE 4 MG PO TBPK: ORAL_TABLET | ORAL | 0 refills | Status: DC

## 2018-12-28 ENCOUNTER — Other Ambulatory Visit: Payer: Self-pay | Admitting: Podiatry

## 2018-12-28 DIAGNOSIS — M7672 Peroneal tendinitis, left leg: Secondary | ICD-10-CM

## 2018-12-31 NOTE — Progress Notes (Signed)
   HPI: 33 y.o. female presenting today as a new patient with a chief complaint of left ankle pain and weakness that has been ongoing for the past 20 years. She reports a "searing" pain in the lateral left foot. She has had injections in the past which helped alleviate the pain. She has not had any recent treatment. Patient is here for further evaluation and treatment.   Past Medical History:  Diagnosis Date  . Congenital heart defect    pacemaker placed in 2005     Physical Exam: General: The patient is alert and oriented x3 in no acute distress.  Dermatology: Skin is warm, dry and supple bilateral lower extremities. Negative for open lesions or macerations.  Vascular: Palpable pedal pulses bilaterally. No edema or erythema noted. Capillary refill within normal limits.  Neurological: Epicritic and protective threshold grossly intact bilaterally.   Musculoskeletal Exam: Pain with palpation to the insertion peroneal tendon of the left foot. Range of motion within normal limits to all pedal and ankle joints bilateral. Muscle strength 5/5 in all groups bilateral.   Radiographic Exam:  Normal osseous mineralization. Joint spaces preserved. No fracture/dislocation/boney destruction.    Assessment: 1. Insertional peroneal tendinitis left    Plan of Care:  1. Patient evaluated. X-Rays reviewed.  2. Injection of 0.5 mLs Celestone Soluspan injected into the insertion of the peroneal tendon sheath of the left foot.  3. Prescription for Medrol Dose Pak provided to patient. 4. Prescription for Meloxicam provided to patient. 5. CAM boot dispensed.  6. Return to clinic in 4 weeks.   Careers information officer at [employer].       Edrick Kins, DPM [facility]  Dr. Edrick Kins, DPM    [address], [place] [address]                Office [phone]  Fax [phone]

## 2019-01-23 ENCOUNTER — Ambulatory Visit: Payer: BC Managed Care – PPO | Admitting: Podiatry

## 2019-04-27 ENCOUNTER — Ambulatory Visit: Payer: BC Managed Care – PPO | Attending: Internal Medicine

## 2019-04-27 DIAGNOSIS — Z23 Encounter for immunization: Secondary | ICD-10-CM | POA: Insufficient documentation

## 2019-04-27 NOTE — Progress Notes (Signed)
   Covid-19 Vaccination Clinic  Name:  Melinda Medina    MRN: [id number] DOB: 1985/11/10  04/27/2019  Melinda Medina was observed post Covid-19 immunization for 15 minutes without incident. She was provided with Vaccine Information Sheet and instruction to access the V-Safe system.   Melinda Medina was instructed to call 911 with any severe reactions post vaccine: Marland Kitchen Difficulty breathing  . Swelling of face and throat  . A fast heartbeat  . A bad rash all over body  . Dizziness and weakness   Immunizations Administered    Name Date Dose VIS Date Route   Pfizer COVID-19 Vaccine 04/27/2019  5:30 PM 0.3 mL 02/01/2019 Intramuscular   Manufacturer: [employer], [employer]   Lot: [id number]   NDC: [address]-6

## 2019-05-18 ENCOUNTER — Ambulatory Visit: Payer: BC Managed Care – PPO | Attending: Internal Medicine

## 2019-05-18 DIAGNOSIS — Z23 Encounter for immunization: Secondary | ICD-10-CM

## 2019-05-18 NOTE — Progress Notes (Signed)
   Covid-19 Vaccination Clinic  Name:  Melinda Medina    MRN: [id number] DOB: [birth year]  05/18/2019  [name] was observed post Covid-19 immunization for 15 minutes without incident. She was provided with Vaccine Information Sheet and instruction to access the V-Safe system.   [name] was instructed to call 911 with any severe reactions post vaccine: Marland Kitchen Difficulty breathing  . Swelling of face and throat  . A fast heartbeat  . A bad rash all over body  . Dizziness and weakness   Immunizations Administered    Name Date Dose VIS Date Route   Pfizer COVID-19 Vaccine 05/18/2019  9:42 AM 0.3 mL 02/01/2019 Intramuscular   Manufacturer: [employer], [employer]   Lot: [id number]   NDC: [address]-6

## 2020-01-25 ENCOUNTER — Ambulatory Visit: Payer: BC Managed Care – PPO | Attending: Internal Medicine

## 2020-01-25 DIAGNOSIS — Z23 Encounter for immunization: Secondary | ICD-10-CM

## 2020-01-25 NOTE — Progress Notes (Signed)
   Covid-19 Vaccination Clinic  Name:  Melinda Medina    MRN: [id number] DOB: June 01, 1985  01/25/2020  Melinda Medina was observed post Covid-19 immunization for 15 minutes without incident. She was provided with Vaccine Information Sheet and instruction to access the V-Safe system.   Melinda Medina was instructed to call 911 with any severe reactions post vaccine: Marland Kitchen Difficulty breathing  . Swelling of face and throat  . A fast heartbeat  . A bad rash all over body  . Dizziness and weakness   Immunizations Administered    Name Date Dose VIS Date Route   Pfizer COVID-19 Vaccine 01/25/2020 12:56 PM 0.3 mL 12/11/2019 Intramuscular   Manufacturer: [employer], [employer]   Lot: [id number]   NDC: [address]-8

## 2022-10-11 ENCOUNTER — Telehealth: Payer: Self-pay | Admitting: Genetic Counselor

## 2022-10-11 NOTE — Telephone Encounter (Signed)
Patient is aware of scheduled appointments with the [facility]

## 2022-10-20 ENCOUNTER — Inpatient Hospital Stay: Payer: BC Managed Care – PPO | Attending: Genetic Counselor | Admitting: Genetic Counselor

## 2022-10-20 ENCOUNTER — Encounter: Payer: Self-pay | Admitting: Genetic Counselor

## 2022-10-20 ENCOUNTER — Inpatient Hospital Stay: Payer: BC Managed Care – PPO

## 2022-10-20 DIAGNOSIS — Z803 Family history of malignant neoplasm of breast: Secondary | ICD-10-CM | POA: Insufficient documentation

## 2022-10-20 LAB — GENETIC SCREENING ORDER

## 2022-10-20 NOTE — Progress Notes (Signed)
REFERRING PROVIDER: Lavonna Monarch, FNP [address] [place],  [place] [address]  PRIMARY PROVIDER:  Juluis Rainier, MD (Inactive)  PRIMARY REASON FOR VISIT:  1. Family history of breast cancer      HISTORY OF PRESENT ILLNESS:   Melinda Medina, a 37 y.o. female, was seen for a [place] cancer genetics consultation at the request of Dr. Erling Medina due to a family history of cancer.  Melinda Medina presents to clinic today to discuss the possibility of a hereditary predisposition to cancer, genetic testing, and to further clarify her future cancer risks, as well as potential cancer risks for family members.   Melinda Medina is a 37 y.o. female with no personal history of cancer.    CANCER HISTORY:  Oncology History   No history exists.     RISK FACTORS:  Menarche was at age 88.  First live birth at age 48.  OCP use for approximately 1 years.  Ovaries intact: yes.  Hysterectomy: no.  Menopausal status: premenopausal.  HRT use: 0 years. Colonoscopy: no; not examined. Mammogram within the last year: no. Number of breast biopsies: 0. Up to date with pelvic exams: yes. Any excessive radiation exposure in the past: no  Past Medical History:  Diagnosis Date   Congenital heart defect    pacemaker placed in 2005   Family history of breast cancer     Past Surgical History:  Procedure Laterality Date   PACEMAKER INSERTION     placed 2005    Social History   Socioeconomic History   Marital status: Married    Spouse name: Not on file   Number of children: Not on file   Years of education: Not on file   Highest education level: Not on file  Occupational History   Not on file  Tobacco Use   Smoking status: Never   Smokeless tobacco: Not on file  Substance and Sexual Activity   Alcohol use: No   Drug use: No   Sexual activity: Not on file  Other Topics Concern   Not on file  Social History Narrative   Not on file   Social Determinants of Health    Financial Resource Strain: Not on file  Food Insecurity: Not on file  Transportation Needs: Not on file  Physical Activity: Not on file  Stress: Not on file  Social Connections: Not on file     FAMILY HISTORY:  We obtained a detailed, 4-generation family history.  Significant diagnoses are listed below: Family History  Problem Relation Age of Onset   Breast cancer Mother 99   Heart attack Father       The patient has one son who is cancer free.  The patient admits that Melinda Medina is estranged from her family and does not know health history for any family members other than her parents.  Her mother had breast cancer at 69 and died at 81.  Her father died of a heart attack at 69.   Melinda Medina is unaware of previous family history of genetic testing for hereditary cancer risks. There is no reported Ashkenazi Jewish ancestry. There is no known consanguinity.  GENETIC COUNSELING ASSESSMENT: Melinda Medina is a 37 y.o. female with a family history of breast cancer which is somewhat suggestive of a hereditary cancer syndrome and predisposition to cancer given her mother's younger age of onset. We, therefore, discussed and recommended the following at today's visit.   DISCUSSION: We discussed that, in general, most cancer is  not inherited in families, but instead is sporadic or familial. Sporadic cancers occur by chance and typically happen at older ages (>50 years) as this type of cancer is caused by genetic changes acquired during an individual's lifetime. Some families have more cancers than would be expected by chance; however, the ages or types of cancer are not consistent with a known genetic mutation or known genetic mutations have been ruled out. This type of familial cancer is thought to be due to a combination of multiple genetic, environmental, hormonal, and lifestyle factors. While this combination of factors likely increases the risk of cancer, the exact source of this risk is not currently  identifiable or testable.  We discussed that 5 - 10% of breast cancer is hereditary, with most cases associated with BRCA mutations.  There are other genes that can be associated with hereditary breast cancer syndromes.  These include ATM, CHEK2 and PALB2, which are considered moderate risk genes.  We discussed that testing is beneficial for several reasons including knowing how to follow individuals after completing their treatment, identifying whether potential treatment options such as PARP inhibitors would be beneficial, and understand if other family members could be at risk for cancer and allow them to undergo genetic testing.   We reviewed the characteristics, features and inheritance patterns of hereditary cancer syndromes. We also discussed genetic testing, including the appropriate family members to test, the process of testing, insurance coverage and turn-around-time for results. We discussed the implications of a negative, positive, carrier and/or variant of uncertain significant result. Melinda Medina  was offered a common hereditary cancer panel (40+ genes) and an expanded pan-cancer panel (70+ genes). Melinda Medina was informed of the benefits and limitations of each panel, including that expanded pan-cancer panels contain genes that do not have clear management guidelines at this point in time.  We also discussed that as the number of genes included on a panel increases, the chances of variants of uncertain significance increases. Melinda Medina decided to pursue genetic testing for the CancerNext-Expanded+RNAinsight gene panel.   The CancerNext-Expanded gene panel offered by [facility] and includes sequencing and rearrangement analysis for the following 77 genes: AIP, ALK, APC*, ATM*, AXIN2, BAP1, BARD1, BMPR1A, BRCA1*, BRCA2*, BRIP1*, CDC73, CDH1*, CDK4, CDKN1B, CDKN2A, CHEK2*, CTNNA1, DICER1, FH, FLCN, KIF1B, LZTR1, MAX, MEN1, MET, MLH1*, MSH2*, MSH3, MSH6*, MUTYH*, NF1*, NF2, NTHL1, PALB2*, PHOX2B,  PMS2*, POT1, PRKAR1A, PTCH1, PTEN*, RAD51C*, RAD51D*, RB1, RET, SDHA, SDHAF2, SDHB, SDHC, SDHD, SMAD4, SMARCA4, SMARCB1, SMARCE1, STK11, SUFU, TMEM127, TP53*, TSC1, TSC2, and VHL (sequencing and deletion/duplication); EGFR, EGLN1, HOXB13, KIT, MITF, PDGFRA, POLD1, and POLE (sequencing only); EPCAM and GREM1 (deletion/duplication only). DNA and RNA analyses performed for * genes.   Based on Melinda Medina's family history of cancer, Melinda Medina meets medical criteria for genetic testing. Despite that Melinda Medina meets criteria, Melinda Medina may still have an out of pocket cost. We discussed that if her out of pocket cost for testing is over $100, the laboratory will call and confirm whether Melinda Medina wants to proceed with testing.  If the out of pocket cost of testing is less than $100 Melinda Medina will be billed by the genetic testing laboratory.   We discussed that some people do not want to undergo genetic testing due to fear of genetic discrimination.  The Genetic Information Nondiscrimination Act (GINA) was signed into federal law in 2008. GINA prohibits health insurers and most employers from discriminating against individuals based on genetic information (including the results of genetic tests and family history information).  According to GINA, health insurance companies cannot consider genetic information to be a preexisting condition, nor can they use it to make decisions regarding coverage or rates. GINA also makes it illegal for most employers to use genetic information in making decisions about hiring, firing, promotion, or terms of employment. It is important to note that GINA does not offer protections for life insurance, disability insurance, or long-term care insurance. GINA does not apply to those in the [employer], those who work for companies with less than 15 employees, and new life insurance or long-term disability insurance policies.  Health status due to a cancer diagnosis is not protected under GINA. More information about GINA can be  found by visiting [email or web address].   Based on the patient's family history, a statistical model (Tyrer Cusik) was used to estimate her risk of developing breast cancer. This estimates her lifetime risk of developing breast cancer to be approximately 17.1%. This estimation does not consider any genetic testing results.  The patient's lifetime breast cancer risk is a preliminary estimate based on available information using one of several models endorsed by the American Cancer Society (ACS). The ACS recommends consideration of breast MRI screening as an adjunct to mammography for patients at high risk (defined as 20% or greater lifetime risk). Please note that a woman's breast cancer risk changes over time. It may increase or decrease based on age and any changes to the personal and/or family medical history. The risks and recommendations listed above apply to this patient at this point in time. In the future, Melinda Medina may or may not be eligible for the same medical management strategies and, in some cases, other medical management strategies may become available to her. If Melinda Medina is interested in an updated breast cancer risk assessment at a later date, Melinda Medina can contact us.     PLAN: After considering the risks, benefits, and limitations, Melinda Medina provided informed consent to pursue genetic testing and the blood sample was sent to [facility] for analysis of the CancerNext-Expanded+RNAinsight panel. Results should be available within approximately 2-3 weeks' time, at which point they will be disclosed by telephone to Melinda Medina, as will any additional recommendations warranted by these results. Melinda Medina will receive a summary of her genetic counseling visit and a copy of her results once available. This information will also be available in Epic.   Lastly, we encouraged Melinda Medina to remain in contact with cancer genetics annually so that we can continuously update the family history and inform her of  any changes in cancer genetics and testing that may be of benefit for this family.   Melinda Medina questions were answered to her satisfaction today. Our contact information was provided should additional questions or concerns arise. Thank you for the referral and allowing [place] to share in the care of your patient.   Korbin Notaro P. Lowell Guitar, MS, [facility] Licensed, Patent attorney [email or web address] phone: [phone]  The patient was seen for a total of 30 minutes in face-to-face genetic counseling.  The patient was seen alone.  Drs. Meliton Rattan, and/or [place] were available for questions, if needed..    _______________________________________________________________________ For Office Staff:  Number of people involved in session: 1 Was an Intern/ student involved with case: no

## 2022-11-14 ENCOUNTER — Telehealth: Payer: Self-pay | Admitting: Genetic Counselor

## 2022-11-14 ENCOUNTER — Encounter: Payer: Self-pay | Admitting: Genetic Counselor

## 2022-11-14 ENCOUNTER — Ambulatory Visit: Payer: Self-pay | Admitting: Genetic Counselor

## 2022-11-14 DIAGNOSIS — Z1379 Encounter for other screening for genetic and chromosomal anomalies: Secondary | ICD-10-CM | POA: Insufficient documentation

## 2022-11-14 NOTE — Progress Notes (Signed)
HPI:  Melinda Medina was previously seen in the [place] [facility] due to a family history of breast and concerns regarding a hereditary predisposition to cancer. Please refer to our prior cancer genetics clinic note for more information regarding our discussion, assessment and recommendations, at the time. Melinda Medina recent genetic test results were disclosed to her, as were recommendations warranted by these results. These results and recommendations are discussed in more detail below.  CANCER HISTORY:  Oncology History   No history exists.    FAMILY HISTORY:  We obtained a detailed, 4-generation family history.  Significant diagnoses are listed below: Family History  Problem Relation Age of Onset   Breast cancer Mother 5   Heart attack Father          The patient has one son who is cancer free.  The patient admits that she is estranged from her family and does not know health history for any family members other than her parents.  Her mother had breast cancer at 76 and died at 41.  Her father died of a heart attack at 79.    Melinda Medina is unaware of previous family history of genetic testing for hereditary cancer risks. There is no reported Ashkenazi Jewish ancestry. There is no known consanguinity  GENETIC TEST RESULTS: Genetic testing reported out on November 11, 2022 through the CancerNext-Expanded+RNAinsight cancer panel found no pathogenic mutations. The CancerNext-Expanded gene panel offered by [facility] and includes sequencing and rearrangement analysis for the following 77 genes: AIP, ALK, APC*, ATM*, AXIN2, BAP1, BARD1, BMPR1A, BRCA1*, BRCA2*, BRIP1*, CDC73, CDH1*, CDK4, CDKN1B, CDKN2A, CHEK2*, CTNNA1, DICER1, FH, FLCN, KIF1B, LZTR1, MAX, MEN1, MET, MLH1*, MSH2*, MSH3, MSH6*, MUTYH*, NF1*, NF2, NTHL1, PALB2*, PHOX2B, PMS2*, POT1, PRKAR1A, PTCH1, PTEN*, RAD51C*, RAD51D*, RB1, RET, SDHA, SDHAF2, SDHB, SDHC, SDHD, SMAD4, SMARCA4, SMARCB1, SMARCE1, STK11, SUFU,  TMEM127, TP53*, TSC1, TSC2, and VHL (sequencing and deletion/duplication); EGFR, EGLN1, HOXB13, KIT, MITF, PDGFRA, POLD1, and POLE (sequencing only); EPCAM and GREM1 (deletion/duplication only). DNA and RNA analyses performed for * genes. The test report has been scanned into EPIC and is located under the Molecular Pathology section of the Results Review tab.  A portion of the result report is included below for reference.     We discussed with Melinda Medina that because current genetic testing is not perfect, it is possible there may be a gene mutation in one of these genes that current testing cannot detect, but that chance is small.  We also discussed, that there could be another gene that has not yet been discovered, or that we have not yet tested, that is responsible for the cancer diagnoses in the family. It is also possible there is a hereditary cause for the cancer in the family that Melinda Medina did not inherit and therefore was not identified in her testing.  Therefore, it is important to remain in touch with cancer genetics in the future so that we can continue to offer Melinda Medina the most up to date genetic testing.   ADDITIONAL GENETIC TESTING: We discussed with Melinda Medina that her genetic testing was fairly extensive.  If there are genes identified to increase cancer risk that can be analyzed in the future, we would be happy to discuss and coordinate this testing at that time.    CANCER SCREENING RECOMMENDATIONS: Melinda Medina test result is considered negative (normal).  This means that we have not identified a hereditary cause for her family history of breast cancer at this time.  Most cancers happen by chance and this negative test suggests that her family history of cancer may fall into this category.    Possible reasons for Melinda Medina's negative genetic test include:  1. There may be a gene mutation in one of these genes that current testing methods cannot detect but that chance is small.  2. There  could be another gene that has not yet been discovered, or that we have not yet tested, that is responsible for the cancer diagnoses in the family.  3.  There may be no hereditary risk for cancer in the family. The cancers in Melinda Medina and/or her family may be sporadic/familial or due to other genetic and environmental factors. 4. It is also possible there is a hereditary cause for the cancer in the family that Melinda Medina did not inherit.  Therefore, it is recommended she continue to follow the cancer management and screening guidelines provided by her primary healthcare provider. An individual's cancer risk and medical management are not determined by genetic test results alone. Overall cancer risk assessment incorporates additional factors, including personal medical history, family history, and any available genetic information that may result in a personalized plan for cancer prevention and surveillance  RECOMMENDATIONS FOR FAMILY MEMBERS:  Individuals in this family might be at some increased risk of developing cancer, over the general population risk, simply due to the family history of cancer.  We recommended women in this family have a yearly mammogram beginning at age 43, or 29 years younger than the earliest onset of cancer, an annual clinical breast exam, and perform monthly breast self-exams. Women in this family should also have a gynecological exam as recommended by their primary provider. All family members should be referred for colonoscopy starting at age 72.  FOLLOW-UP: Lastly, we discussed with Melinda Medina that cancer genetics is a rapidly advancing field and it is possible that new genetic tests will be appropriate for her and/or her family members in the future. We encouraged her to remain in contact with cancer genetics on an annual basis so we can update her personal and family histories and let her know of advances in cancer genetics that may benefit this family.   Our contact number  was provided. Melinda Medina questions were answered to her satisfaction, and she knows she is welcome to call us at anytime with additional questions or concerns.   Maylon Cos, MS, [facility] Licensed, Certified Genetic Counselor [email or web address]

## 2022-11-14 NOTE — Telephone Encounter (Signed)
Revealed negative genetic testing.  Discussed that we do not know why there is cancer in the family. It could be due to a different gene that we are not testing, or maybe our current technology may not be able to pick something up.  It will be important for her to keep in contact with genetics to keep up with whether additional testing may be needed.

## 2023-12-04 ENCOUNTER — Ambulatory Visit: Payer: Self-pay | Admitting: Physician Assistant

## 2024-03-07 ENCOUNTER — Ambulatory Visit: Payer: Self-pay | Admitting: Dermatology

## 2024-03-07 ENCOUNTER — Encounter: Payer: Self-pay | Admitting: Dermatology

## 2024-03-07 VITALS — BP 122/82 | HR 73

## 2024-03-07 DIAGNOSIS — L83 Acanthosis nigricans: Secondary | ICD-10-CM | POA: Diagnosis not present

## 2024-03-07 DIAGNOSIS — L649 Androgenic alopecia, unspecified: Secondary | ICD-10-CM | POA: Diagnosis not present

## 2024-03-07 DIAGNOSIS — Z79899 Other long term (current) drug therapy: Secondary | ICD-10-CM | POA: Diagnosis not present

## 2024-03-07 MED ORDER — SAFETY SEAL MISCELLANEOUS MISC: 1.0000 | Freq: Every morning | 9 refills | Status: AC

## 2024-03-07 MED ORDER — SAFETY SEAL MISCELLANEOUS MISC: 1.0000 | Freq: Every evening | 3 refills | Status: AC

## 2024-03-07 NOTE — Progress Notes (Signed)
 "  New Patient Visit   Subjective  Melinda Medina is a 39 y.o. female who presents for the following: Hair Concerns and dark spot under chin  Patient states she has hair thinning located at the scalp that she would like to have examined. Patient reports the areas have been there for several years. She reports the areas are not bothersome.Patient reports she has not previously been treated for these areas. Patient denies Hx of bx. Patient denies family history of hair loss.  Spot: Patient reports   Patient provided verbal consent for the use of an AI-assisted program to generate a detailed after-visit summary. The patient understands that the AI tool is used to support clinical documentation and that all information will be reviewed and verified by the healthcare provider.  The following portions of the chart were reviewed this encounter and updated as appropriate: medications, allergies, medical history  Review of Systems:  No other skin or systemic complaints except as noted in HPI or Assessment and Plan.  Objective  Well appearing patient in no apparent distress; mood and affect are within normal limits.  A focused examination was performed of the following areas: Chin and Scalp  Relevant exam findings are noted in the Assessment and Plan.    Assessment & Plan   Acanthosis Nigracans Exam: Dark velvety dark patch along neck  Chronic acanthosis nigricans present for at least ten years, with increased visibility due to weight gain. No associated hyperglycemia as recent hemoglobin A1c was normal. Clinical sign of insulin resistance, though not officially prediabetic. - Recommended weight loss to reduce insulin resistance and improve acanthosis nigricans.  - Prescribed glycolic acid toner by [employer] for exfoliation, to be used every night or two to three nights a week if dryness occurs. - Prescribed 8% hydroquinone with kojic acid cream for lightening, to be used every other  night, alternating with glycolic acid toner. - Advised follow-up in three to four months to assess improvement.  ANDROGENETIC ALOPECIA (FEMALE PATTERN HAIR LOSS) Exam: Diffuse thinning of the crown and widening of the midline part with retention of the frontal hairline  Flared  Pt presented with Diffuse hair thinning, more pronounced in certain areas, likely due to genetic and age-related factors. Early intervention is crucial to prevent further thinning.  - Prescribed minoxidil and finasteride for hair regrowth, to be applied in the morning to avoid unwanted hair on the pillow. - Advised application of a thin layer of the compound, ensuring hands are washed after application. - Scheduled follow-up in three to four months to evaluate improvement.   Patient Education Discussed During Visit: Female Androgenic Alopecia is a chronic condition related to genetics and/or hormonal changes.  In women androgenetic alopecia is commonly associated with menopause but may occur any time after puberty.  It causes hair thinning primarily on the crown with widening of the part and temporal hairline recession.  Can use OTC Rogaine (minoxidil) 5% solution/foam as directed.  Oral treatments in female patients who have no contraindication may include :    Long term medication management.  Patient is using long term (months to years) prescription medication  to control their dermatologic condition.  These medications require periodic monitoring to evaluate for efficacy and side effects and may require periodic laboratory monitoring.  ACANTHOSIS NIGRICANS   This Visit - Safety Seal Miscellaneous MISC - Apply 1 Application topically at bedtime. Medication Name: Hy-Glow (Hydroquinone 12% and Kojic Acid 6%) ANDROGENIC ALOPECIA   This Visit - Safety Seal Miscellaneous  MISC - Apply 1 Application topically in the morning. Medication name: Hormonic Hair Solution (Minoxidil 8%, Finasteride 0.1%)  Return in about  2 months (around 05/13/2024) for f/u.  I, Jetta Ager, am acting as neurosurgeon for [employer], DO.  Documentation: I have reviewed the above documentation for accuracy and completeness, and I agree with the above.  Delon Lenis, DO     "

## 2024-03-07 NOTE — Patient Instructions (Addendum)
 " VISIT SUMMARY:  During your visit, we discussed the dark patch on your teeth and hair thinning. We have developed a treatment plan to address both issues.  YOUR PLAN:  -ACANTHOSIS NIGRICANS:  Acanthosis nigricans is a skin condition characterized by dark, velvety patches in body folds and creases, often associated with insulin resistance. To help reduce the visibility of the dark patch, I recommend weight loss to improve insulin resistance.   Additionally, I have recommended [facility]  glycolic acid toner to be used every other night alternating with an 8% hydroquinone with kojic acid cream for lightening, to be used every other night, alternating with the glycolic acid toner. We will follow up in three to four months to assess improvement.  -ANDROGENETIC ALOPECIA:  Androgenetic alopecia is a common form of hair loss in both men and women, often due to genetic and age-related factors. To address your hair thinning, I have prescribed minoxidil and finasteride for hair regrowth. These should be applied in the morning to avoid unwanted hair on your pillow. Apply a thin layer of the compound and ensure you wash your hands after application. We will follow up in three to four months to evaluate improvement.  INSTRUCTIONS:  Please schedule a follow-up appointment in three to four months to assess the improvement of both the dark patch and hair thinning.      Important Information  Due to recent changes in healthcare laws, you may see results of your pathology and/or laboratory studies on MyChart before the doctors have had a chance to review them. We understand that in some cases there may be results that are confusing or concerning to you. Please understand that not all results are received at the same time and often the doctors may need to interpret multiple results in order to provide you with the best plan of care or course of treatment. Therefore, we ask that you please give us  2 business  days to thoroughly review all your results before contacting the office for clarification. Should we see a critical lab result, you will be contacted sooner.   If You Need Anything After Your Visit  If you have any questions or concerns for your doctor, please call our main line at [phone] If no one answers, please leave a voicemail as directed and we will return your call as soon as possible. Messages left after 4 pm will be answered the following business day.   You may also send us  a message via MyChart. We typically respond to MyChart messages within 1-2 business days.  For prescription refills, please ask your pharmacy to contact our office. Our fax number is [phone].  If you have an urgent issue when the clinic is closed that cannot wait until the next business day, you can page your doctor at the number below.    Please note that while we do our best to be available for urgent issues outside of office hours, we are not available 24/7.   If you have an urgent issue and are unable to reach us , you may choose to seek medical care at your doctor's office, retail clinic, urgent care center, or emergency room.  If you have a medical emergency, please immediately call 911 or go to the emergency department. In the event of inclement weather, please call our main line at [phone] for an update on the status of any delays or closures.  Dermatology Medication Tips: Please keep the boxes that topical medications come in in order to  help keep track of the instructions about where and how to use these. Pharmacies typically print the medication instructions only on the boxes and not directly on the medication tubes.   If your medication is too expensive, please contact our office at [phone] or send us  a message through MyChart.   We are unable to tell what your co-pay for medications will be in advance as this is different depending on your insurance coverage. However, we may be  able to find a substitute medication at lower cost or fill out paperwork to get insurance to cover a needed medication.   If a prior authorization is required to get your medication covered by your insurance company, please allow us  1-2 business days to complete this process.  Drug prices often vary depending on where the prescription is filled and some pharmacies may offer cheaper prices.  The website www.goodrx.com contains coupons for medications through different pharmacies. The prices here do not account for what the cost may be with help from insurance (it may be cheaper with your insurance), but the website can give you the price if you did not use any insurance.  - You can print the associated coupon and take it with your prescription to the pharmacy.  - You may also stop by our office during regular business hours and pick up a GoodRx coupon card.  - If you need your prescription sent electronically to a different pharmacy, notify our office through [facility] or by phone at [phone]     "

## 2024-03-17 ENCOUNTER — Encounter: Payer: Self-pay | Admitting: Dermatology

## 2024-05-20 ENCOUNTER — Ambulatory Visit: Admitting: Dermatology
